# Patient Record
Sex: Male | Born: 1970 | Race: White | Hispanic: No | Marital: Single | Smoking: Never smoker
Health system: Southern US, Community
[De-identification: ages and names within clinical notes are randomized; demographics above are authoritative.]

## PROBLEM LIST (undated history)

## (undated) DIAGNOSIS — I10 Essential (primary) hypertension: Secondary | ICD-10-CM

---

## 2015-07-22 ENCOUNTER — Emergency Department (HOSPITAL_COMMUNITY)
Admission: EM | Admit: 2015-07-22 | Discharge: 2015-07-22 | Disposition: A | Discharge status: Home / Self Care | Attending: Emergency Medicine | Admitting: Emergency Medicine

## 2015-07-22 ENCOUNTER — Emergency Department (HOSPITAL_COMMUNITY)

## 2015-07-22 ENCOUNTER — Encounter (HOSPITAL_COMMUNITY): Payer: Self-pay | Admitting: Cardiology

## 2015-07-22 DIAGNOSIS — I10 Essential (primary) hypertension: Secondary | ICD-10-CM | POA: Insufficient documentation

## 2015-07-22 DIAGNOSIS — Z79899 Other long term (current) drug therapy: Secondary | ICD-10-CM | POA: Diagnosis not present

## 2015-07-22 DIAGNOSIS — R0789 Other chest pain: Secondary | ICD-10-CM | POA: Diagnosis not present

## 2015-07-22 DIAGNOSIS — R05 Cough: Secondary | ICD-10-CM | POA: Diagnosis not present

## 2015-07-22 DIAGNOSIS — R509 Fever, unspecified: Secondary | ICD-10-CM | POA: Insufficient documentation

## 2015-07-22 DIAGNOSIS — R21 Rash and other nonspecific skin eruption: Secondary | ICD-10-CM | POA: Diagnosis not present

## 2015-07-22 DIAGNOSIS — R0602 Shortness of breath: Secondary | ICD-10-CM | POA: Diagnosis present

## 2015-07-22 DIAGNOSIS — Z792 Long term (current) use of antibiotics: Secondary | ICD-10-CM | POA: Diagnosis not present

## 2015-07-22 DIAGNOSIS — R059 Cough, unspecified: Secondary | ICD-10-CM

## 2015-07-22 HISTORY — DX: Essential (primary) hypertension: I10

## 2015-07-22 LAB — CBC WITH DIFFERENTIAL/PLATELET
Basophils Absolute: 0 10*3/uL (ref 0.0–0.1)
Basophils Relative: 1 % (ref 0–1)
EOS ABS: 0.2 10*3/uL (ref 0.0–0.7)
Eosinophils Relative: 3 % (ref 0–5)
HEMATOCRIT: 46 % (ref 39.0–52.0)
HEMOGLOBIN: 16.1 g/dL (ref 13.0–17.0)
LYMPHS ABS: 2.1 10*3/uL (ref 0.7–4.0)
LYMPHS PCT: 30 % (ref 12–46)
MCH: 30.1 pg (ref 26.0–34.0)
MCHC: 35 g/dL (ref 30.0–36.0)
MCV: 86.1 fL (ref 78.0–100.0)
Monocytes Absolute: 0.7 10*3/uL (ref 0.1–1.0)
Monocytes Relative: 10 % (ref 3–12)
NEUTROS ABS: 4 10*3/uL (ref 1.7–7.7)
NEUTROS PCT: 56 % (ref 43–77)
Platelets: 216 10*3/uL (ref 150–400)
RBC: 5.34 MIL/uL (ref 4.22–5.81)
RDW: 12.3 % (ref 11.5–15.5)
WBC: 7 10*3/uL (ref 4.0–10.5)

## 2015-07-22 LAB — COMPREHENSIVE METABOLIC PANEL
ALK PHOS: 78 U/L (ref 38–126)
ALT: 30 U/L (ref 17–63)
AST: 22 U/L (ref 15–41)
Albumin: 4.5 g/dL (ref 3.5–5.0)
Anion gap: 8 (ref 5–15)
BILIRUBIN TOTAL: 0.7 mg/dL (ref 0.3–1.2)
BUN: 15 mg/dL (ref 6–20)
CALCIUM: 9 mg/dL (ref 8.9–10.3)
CO2: 28 mmol/L (ref 22–32)
CREATININE: 0.88 mg/dL (ref 0.61–1.24)
Chloride: 102 mmol/L (ref 101–111)
GFR calc non Af Amer: >60 mL/min (ref >60)
Glucose, Bld: 100 mg/dL — ABNORMAL HIGH (ref 65–99)
Potassium: 3.3 mmol/L — ABNORMAL LOW (ref 3.5–5.1)
SODIUM: 138 mmol/L (ref 135–145)
Total Protein: 8.1 g/dL (ref 6.5–8.1)

## 2015-07-22 LAB — I-STAT TROPONIN, ED: TROPONIN I, POC: 0 ng/mL (ref 0.00–0.08)

## 2015-07-22 LAB — TROPONIN I: Troponin I: <0.03 ng/mL (ref <0.031)

## 2015-07-22 MED ORDER — ALBUTEROL SULFATE HFA 108 (90 BASE) MCG/ACT IN AERS
2.0000 | INHALATION_SPRAY | Freq: Once | RESPIRATORY_TRACT | Status: AC
  Administered 2015-07-22: 2 via RESPIRATORY_TRACT
  Filled 2015-07-22: qty 6.7

## 2015-07-22 MED ORDER — IPRATROPIUM-ALBUTEROL 0.5-2.5 (3) MG/3ML IN SOLN
3.0000 mL | Freq: Four times a day (QID) | RESPIRATORY_TRACT | Status: DC
  Administered 2015-07-22: 3 mL via RESPIRATORY_TRACT
  Filled 2015-07-22: qty 3

## 2015-07-22 MED ORDER — ALBUTEROL SULFATE HFA 108 (90 BASE) MCG/ACT IN AERS: 2.0000 | INHALATION_SPRAY | RESPIRATORY_TRACT | Status: AC | PRN

## 2015-07-22 NOTE — Discharge Instructions (Signed)
Your chest x-ray does not show evidence of bacterial pneumonia, but we do recommend that you finish her current course of antibiotics. Return without fail for worsening symptoms, including worsening pain, difficulty breathing, or any other symptoms concerning to you.  Cough, Adult  A cough is a reflex. It helps you clear your throat and airways. A cough can help heal your body. A cough can last 2 or 3 weeks (acute) or may last more than 8 weeks (chronic). Some common causes of a cough can include an infection, allergy, or a cold. HOME CARE  Only take medicine as told by your doctor.  If given, take your medicines (antibiotics) as told. Finish them even if you start to feel better.  Use a cold steam vaporizer or humidifier in your home. This can help loosen thick spit (secretions).  Sleep so you are almost sitting up (semi-upright). Use pillows to do this. This helps reduce coughing.  Rest as needed.  Stop smoking if you smoke. GET HELP RIGHT AWAY IF:  You have yellowish-white fluid (pus) in your thick spit.  Your cough gets worse.  Your medicine does not reduce coughing, and you are losing sleep.  You cough up blood.  You have trouble breathing.  Your pain gets worse and medicine does not help.  You have a fever. MAKE SURE YOU:   Understand these instructions.  Will watch your condition.  Will get help right away if you are not doing well or get worse. Document Released: 07/17/2011 Document Revised: 03/20/2014 Document Reviewed: 07/17/2011 [facility] Patient Information 2015 [place], [place]. This information is not intended to replace advice given to you by your health care provider. Make sure you discuss any questions you have with your health care provider.

## 2015-07-22 NOTE — ED Notes (Signed)
This nurse [facility] to approve d/c instructions. Instructions also given to pt and prison guard as well.

## 2015-07-22 NOTE — ED Notes (Addendum)
Sob and chest pain times 2 days.  Being treated for a cough with doxycycline  Also notice blood in his urine  Rash to back times 3 weeks.

## 2015-07-22 NOTE — ED Provider Notes (Signed)
CSN: [id number]     Arrival date & time 07/22/15  0909 History  This chart was scribed for Lavera Guise, MD by Ronney Lion, ED Scribe. This patient was seen in room APA19/AP[facility]19 and the patient's care was started at 9:27 AM.   Chief Complaint  Patient presents with  . Shortness of Breath  . Chest Pain   The history is provided by the patient. No language interpreter was used.    HPI Comments: Hunter Lowery is a 44 y.o. male with a history of hypertension, who presents to the Emergency Department brought in by police complaining of SOB and crampy chest discomfort that began 5 days ago. He also complains of a constant, gradually worsening cough productive with yellow sputum, sometimes blood-tinged. Cest pain overlying lower chest wall, reproduced with coughing. He also endorses a low-grade fever he was told he had yesterday. Patient is being treated for his cough and has taken 3 doses of doxycycline so far. He states he presents today because he has occasionally been feeling lightheaded while walking, due to his SOB and cough. He denies any known sick contact. Patient takes HCTZ daily for his history of HTN. He denies any associated nausea, vomiting, abdominal pain, or diarrhea.    Past Medical History  Diagnosis Date  . Hypertension    History reviewed. No pertinent past surgical history. History reviewed. No pertinent family history. Social History  Substance Use Topics  . Smoking status: Never Smoker   . Smokeless tobacco: None  . Alcohol Use: No    Review of Systems  Constitutional: Positive for fever.  Respiratory: Positive for cough and shortness of breath.   Cardiovascular: Positive for chest pain.  Gastrointestinal: Negative for nausea, vomiting, abdominal pain and diarrhea.  Skin: Positive for rash.  All other systems reviewed and are negative.   Allergies  Review of patient's allergies indicates no known allergies.  Home Medications   Prior to Admission medications    Medication Sig Start Date End Date Taking? Authorizing Provider  diphenhydrAMINE (BENADRYL) 25 MG tablet Take 25 mg by mouth every 6 (six) hours as needed for itching (Rash).   Yes Historical Provider, MD  doxycycline (VIBRA-TABS) 100 MG tablet Take 100 mg by mouth 2 (two) times daily.   Yes Historical Provider, MD  hydrochlorothiazide (HYDRODIURIL) 25 MG tablet Take 25 mg by mouth daily.   Yes Historical Provider, MD  lisinopril (PRINIVIL,ZESTRIL) 20 MG tablet Take 20 mg by mouth daily.   Yes Historical Provider, MD  albuterol (PROVENTIL HFA;VENTOLIN HFA) 108 (90 BASE) MCG/ACT inhaler Inhale 2 puffs into the lungs every 4 (four) hours as needed for wheezing or shortness of breath. 07/22/15   Lavera Guise, MD   BP 126/66 mmHg  Pulse 70  Temp(Src) 97.9 F (36.6 C) (Oral)  Resp 9  Ht 6' (1.829 m)  Wt 243 lb (110.224 kg)  BMI 32.95 kg/m2  SpO2 95% Physical Exam  Constitutional: He appears well-developed and well-nourished. No distress.  HENT:  Head: Normocephalic and atraumatic.  Mouth/Throat: Oropharynx is clear and moist.  Neck: Normal range of motion. Neck supple.  Cardiovascular: Normal rate, regular rhythm and normal heart sounds.   Pulmonary/Chest: Effort normal. No respiratory distress. He has no wheezes. He has no rales.  Bronchospastic cough with deep breathing, otherwise lungs are clear to auscultation bilaterally.   Abdominal: Soft. He exhibits no distension. There is no tenderness.  Neurological: He is alert.  Skin: Skin is warm and dry.  Nursing note and  vitals reviewed.   ED Course  Procedures (including critical care time)  DIAGNOSTIC STUDIES: Oxygen Saturation is 99% on RA, normal by my interpretation.    COORDINATION OF CARE: 9:30 AM - Discussed treatment plan with pt at bedside which includes albuterol treatment, blood tests, and CXR. Advised pt to finish course of doxycycline. Pt verbalized understanding and agreed to plan.  11:27 AM - Pt reports significant  improvement after receiving albuterol treatment. XR findings reviewed with pt. Discussed treatment plan with pt at bedside, and pt agreed to plan.   Labs Review Labs Reviewed  COMPREHENSIVE METABOLIC PANEL - Abnormal; Notable for the following:    Potassium 3.3 (*)    Glucose, Bld 100 (*)    All other components within normal limits  CBC WITH DIFFERENTIAL/PLATELET  TROPONIN I  Rosezena Sensor, ED  Rosezena Sensor, ED    Imaging Review Dg Chest 2 View  07/22/2015   CLINICAL DATA:  Chest pain for 5 days.  Cough for 3 days.  EXAM: CHEST  2 VIEW  COMPARISON:  None.  FINDINGS: Normal cardiac silhouette and mediastinal contours. There is mild diffuse slightly nodular thickening of the pulmonary interstitium. No focal airspace opacities. No pleural effusion or pneumothorax. No evidence of edema. No acute osseus abnormalities.  IMPRESSION: Findings suggestive of airways disease / bronchitis. No focal airspace opacities to suggest pneumonia.   Electronically Signed   By: Simonne Come M.D.   On: 07/22/2015 10:27   I have personally reviewed and evaluated these images and lab results as part of my medical decision-making.   EKG Interpretation   Date/Time:  Sunday July 22 2015 09:21:21 EDT Ventricular Rate:  84 PR Interval:  169 QRS Duration: 109 QT Interval:  420 QTC Calculation: 496 R Axis:   44 Text Interpretation:  Sinus rhythm Borderline prolonged QT interval No  prior EKG for comparison Confirmed by Laren Whaling MD, Shatori Bertucci ([id number]) on 07/22/2015  9:25:05 AM      MDM   Final diagnoses:  Cough  Chest discomfort    44 year old male who presents with shortness of breath and cough. He is nontoxic and in no acute distress on presentation. He speaks in full sentences, breathing comfortably on room air, with normal oxygenation. Lungs are clear to auscultation, but he has notable bronchospastic cough with deep inspiration. Cardiopulmonary exam and the remainder of his exam is unremarkable. He does  not appear fluid overloaded on exam.  his chest pain seems reproducible with, after significant coughing episodes, appears to be more musculoskeletal in nature. EKG is nonischemic and serial troponins are negative. I do not suspect underlying ACS, and he has a heart score 2. Chest x-ray does not show infiltrate, and has changes suggestive of possible bronchitis. He is given a dual neb here, with improvement in his symptoms. He'll be prescribed an albuterol inhaler for home, and instructed to finish his antibiotics as previously prescribed. I discussed further supportive care measures. I he is felt appropriate for discharge home. Strict return and follow-up instructions are reviewed. He expressed understanding of all discharge instructions and felt comfortable to plan of care.   Lavera Guise, MD 07/22/15 [phone]

## 2016-04-29 IMAGING — DX DG CHEST 2V
2 series · 2 of 2 positions shown · non-contrast
Comparison: None.

CLINICAL DATA: Chest pain for 5 days.  Cough for 3 days.

EXAM:
CHEST  2 VIEW

[chest pa]
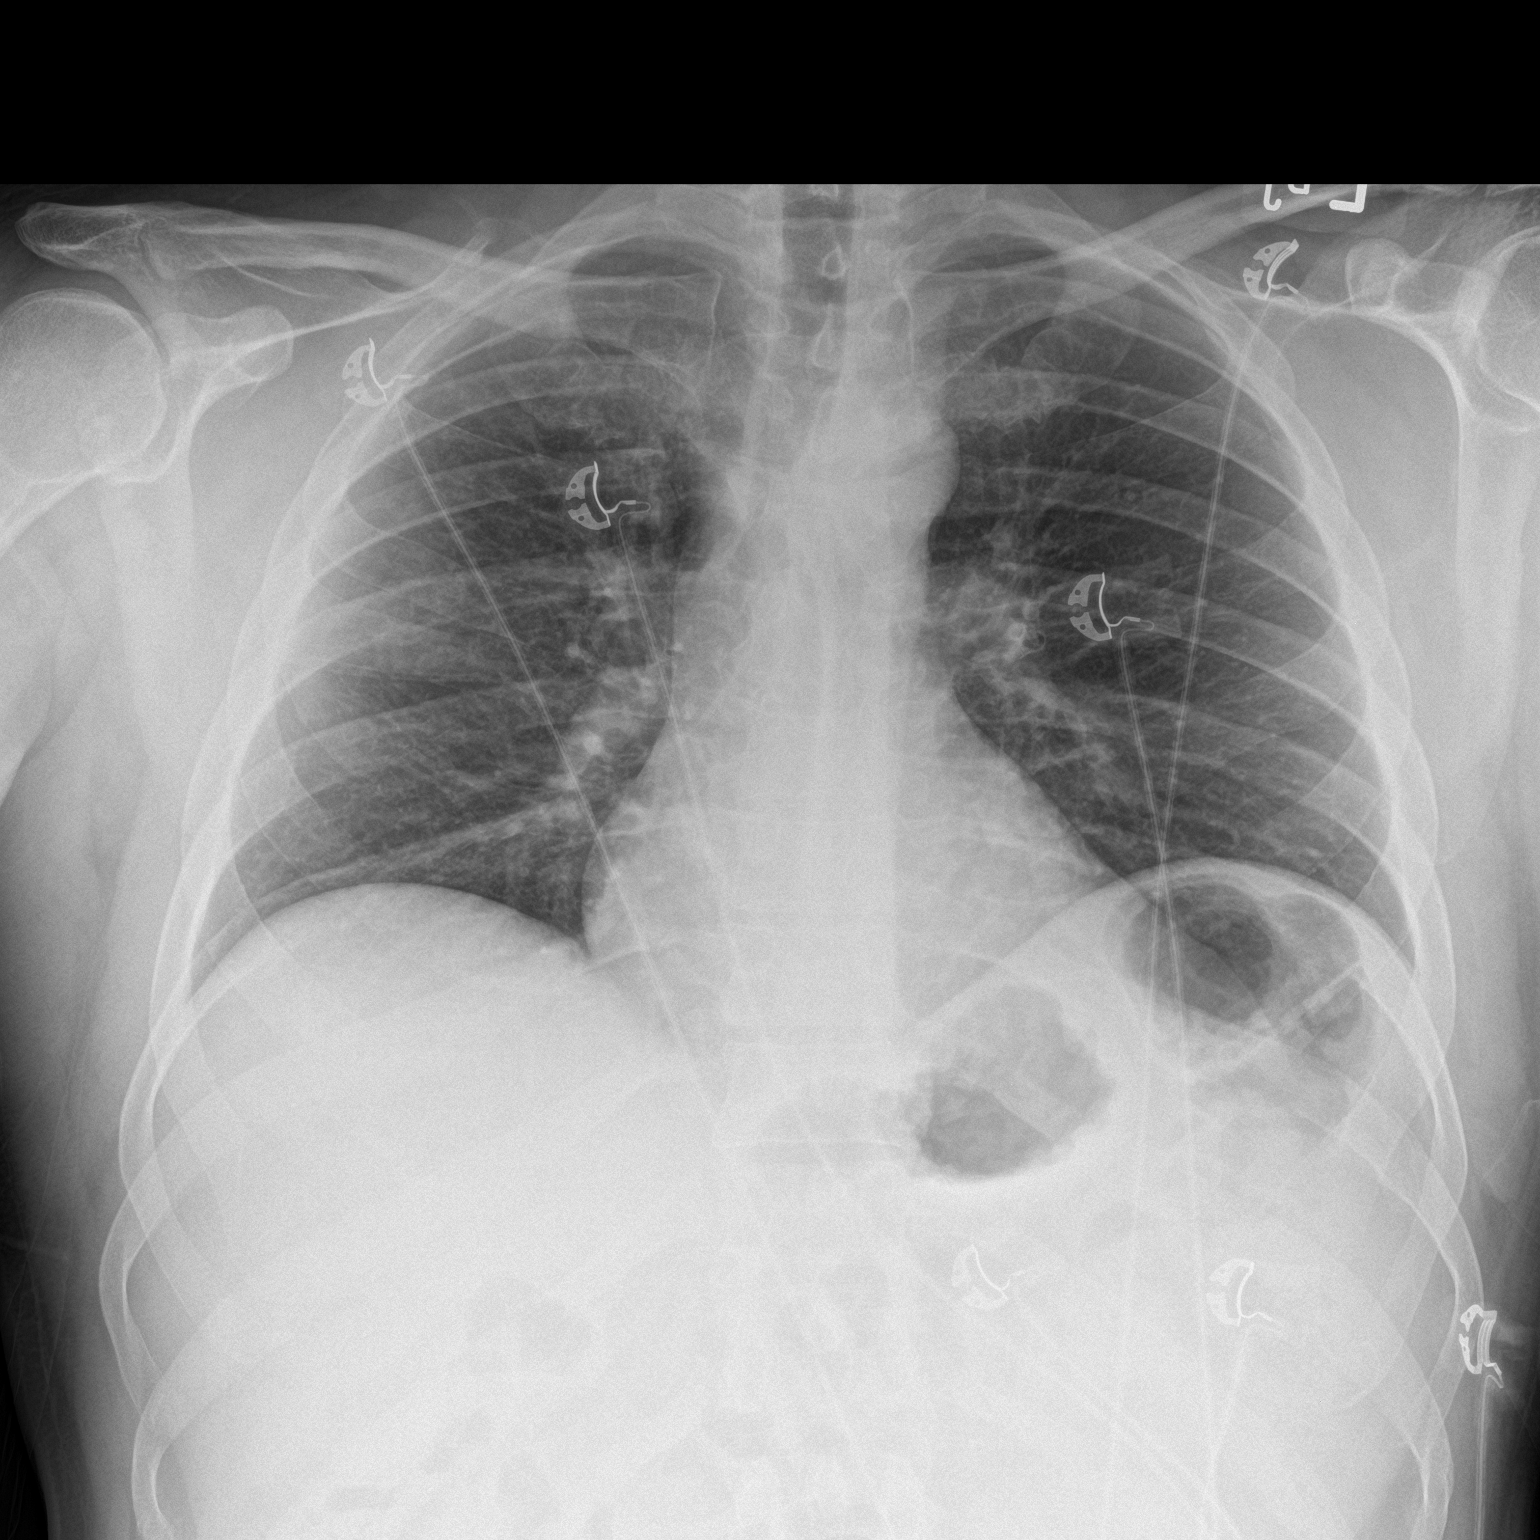

[chest lat]
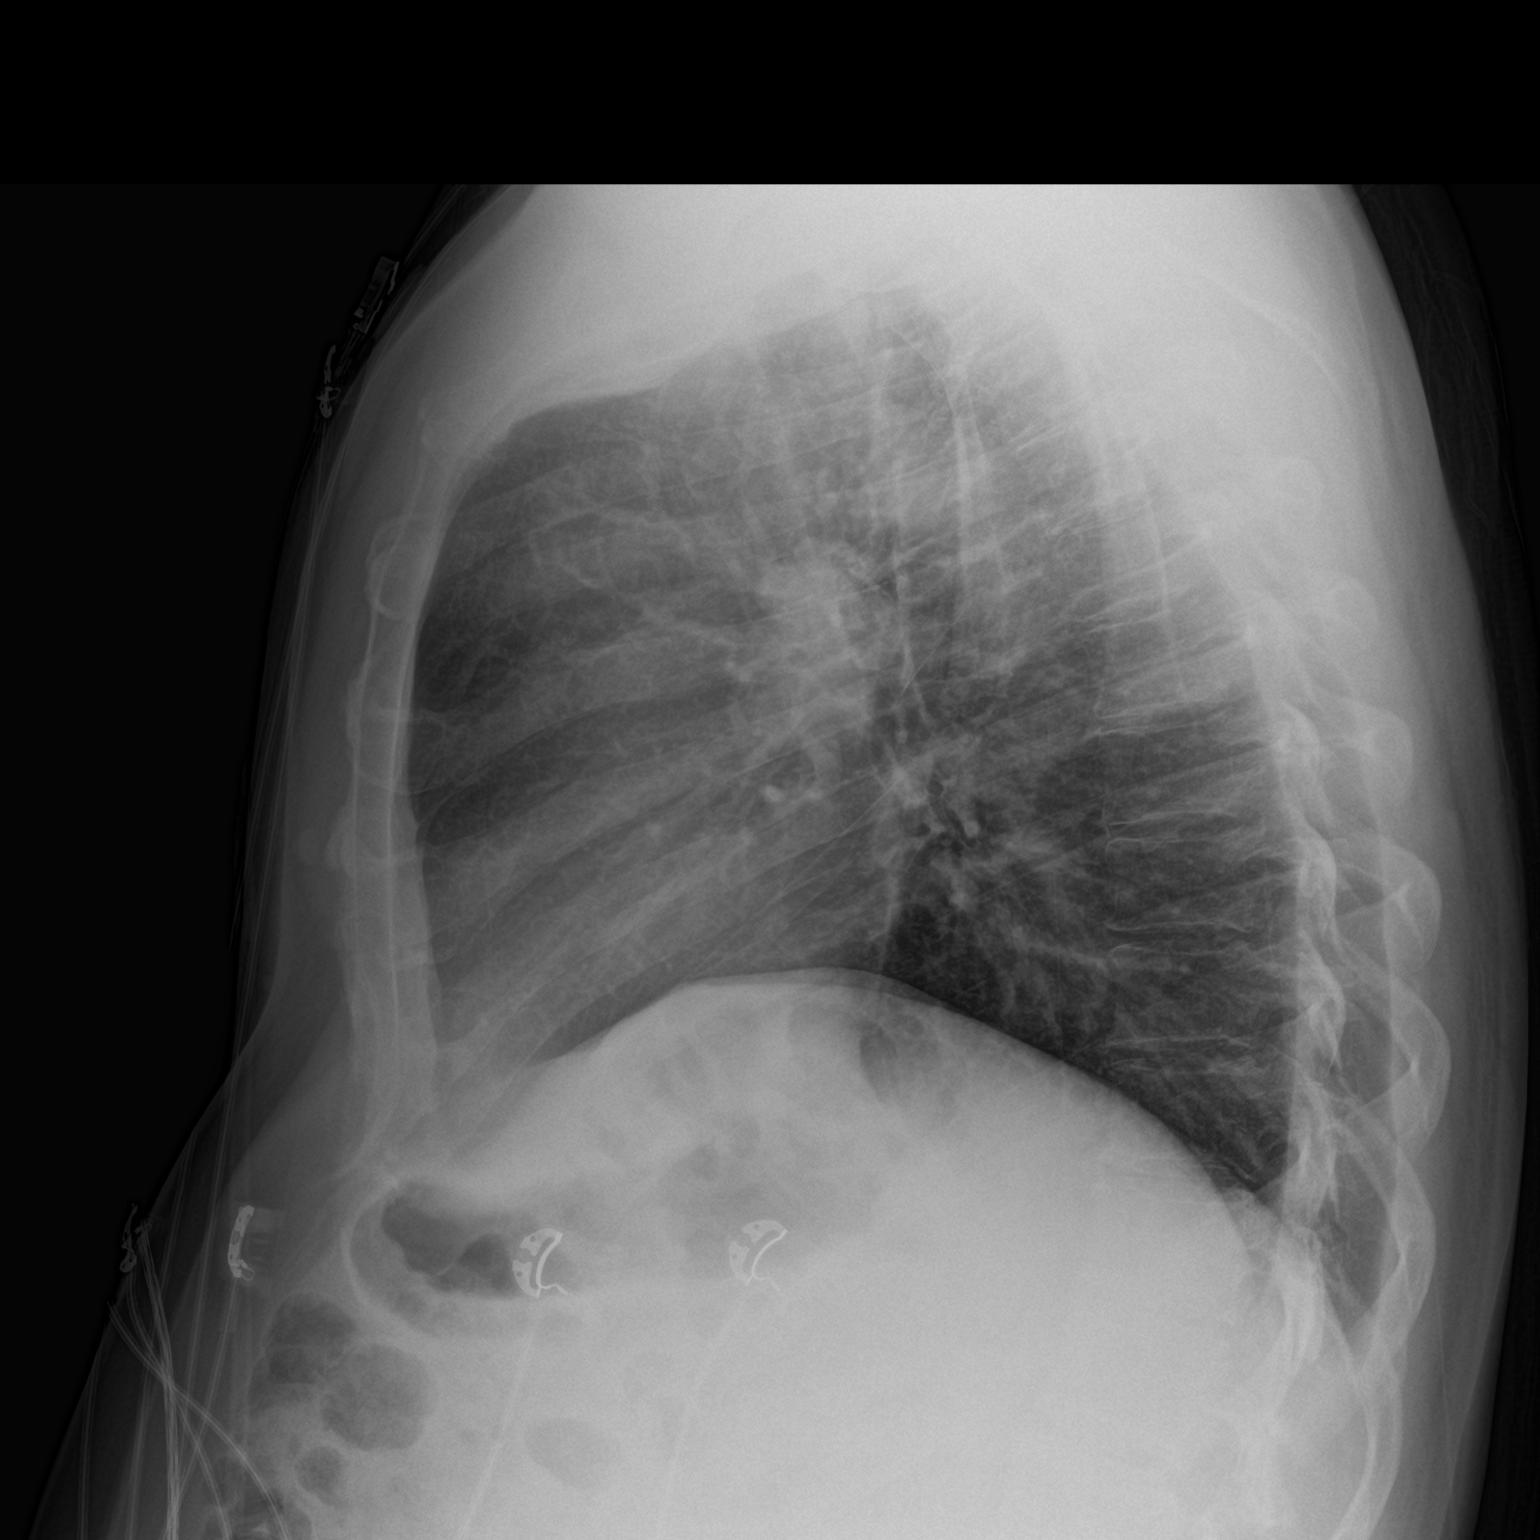

[2 of 2 positions shown; findings below may reference images not displayed]

FINDINGS: Normal cardiac silhouette and mediastinal contours. There is mild
diffuse slightly nodular thickening of the pulmonary interstitium.
No focal airspace opacities. No pleural effusion or pneumothorax. No
evidence of edema. No acute osseus abnormalities.
IMPRESSION: Findings suggestive of airways disease / bronchitis. No focal
airspace opacities to suggest pneumonia.
